# Patient Record
Sex: Male | Born: 1965 | Race: White | Hispanic: No | Marital: Married | Smoking: Never smoker
Health system: Southern US, Community
[De-identification: ages and names within clinical notes are randomized; demographics above are authoritative.]

## PROBLEM LIST (undated history)

## (undated) DIAGNOSIS — I10 Essential (primary) hypertension: Secondary | ICD-10-CM

---

## 1998-11-13 ENCOUNTER — Encounter: Payer: Self-pay | Admitting: Family Medicine

## 1998-11-13 ENCOUNTER — Ambulatory Visit (HOSPITAL_COMMUNITY): Admission: RE | Admit: 1998-11-13 | Discharge: 1998-11-13 | Payer: Self-pay | Admitting: Family Medicine

## 2004-11-23 ENCOUNTER — Emergency Department (HOSPITAL_COMMUNITY): Admission: EM | Admit: 2004-11-23 | Discharge: 2004-11-24 | Payer: Self-pay | Admitting: Emergency Medicine

## 2006-05-03 IMAGING — CR DG CHEST 2V
2 series · 2 of 2 positions shown · non-contrast
Comparison: none

CLINICAL DATA: Chest pain and diaphoresis.  
 CHEST, 2-VIEWS:
 No prior studies.
 The heart size and mediastinal contours are within normal limits.  Both lungs are clear.  The visualized skeletal structures are unremarkable.

[w chest pa]
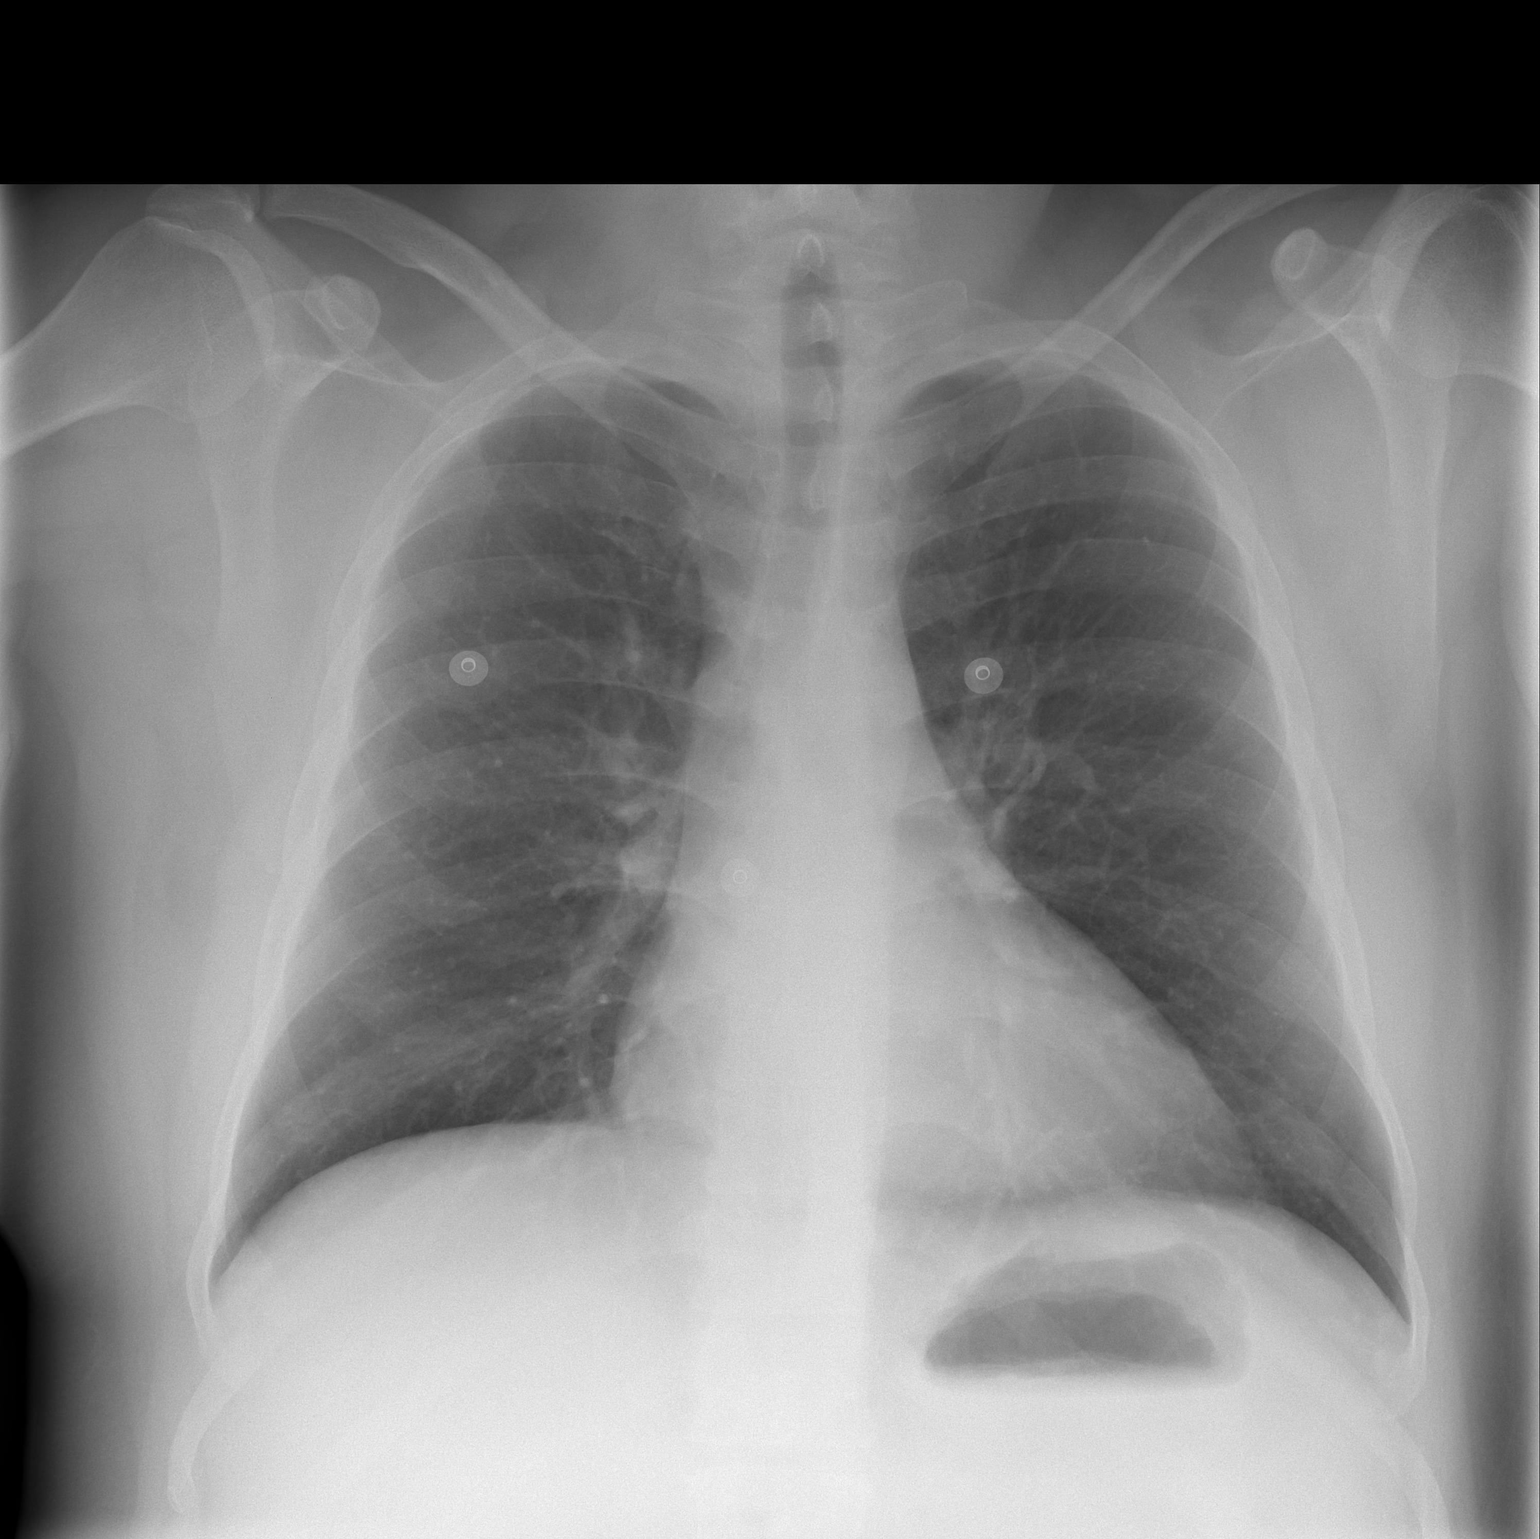

[w chest lat]
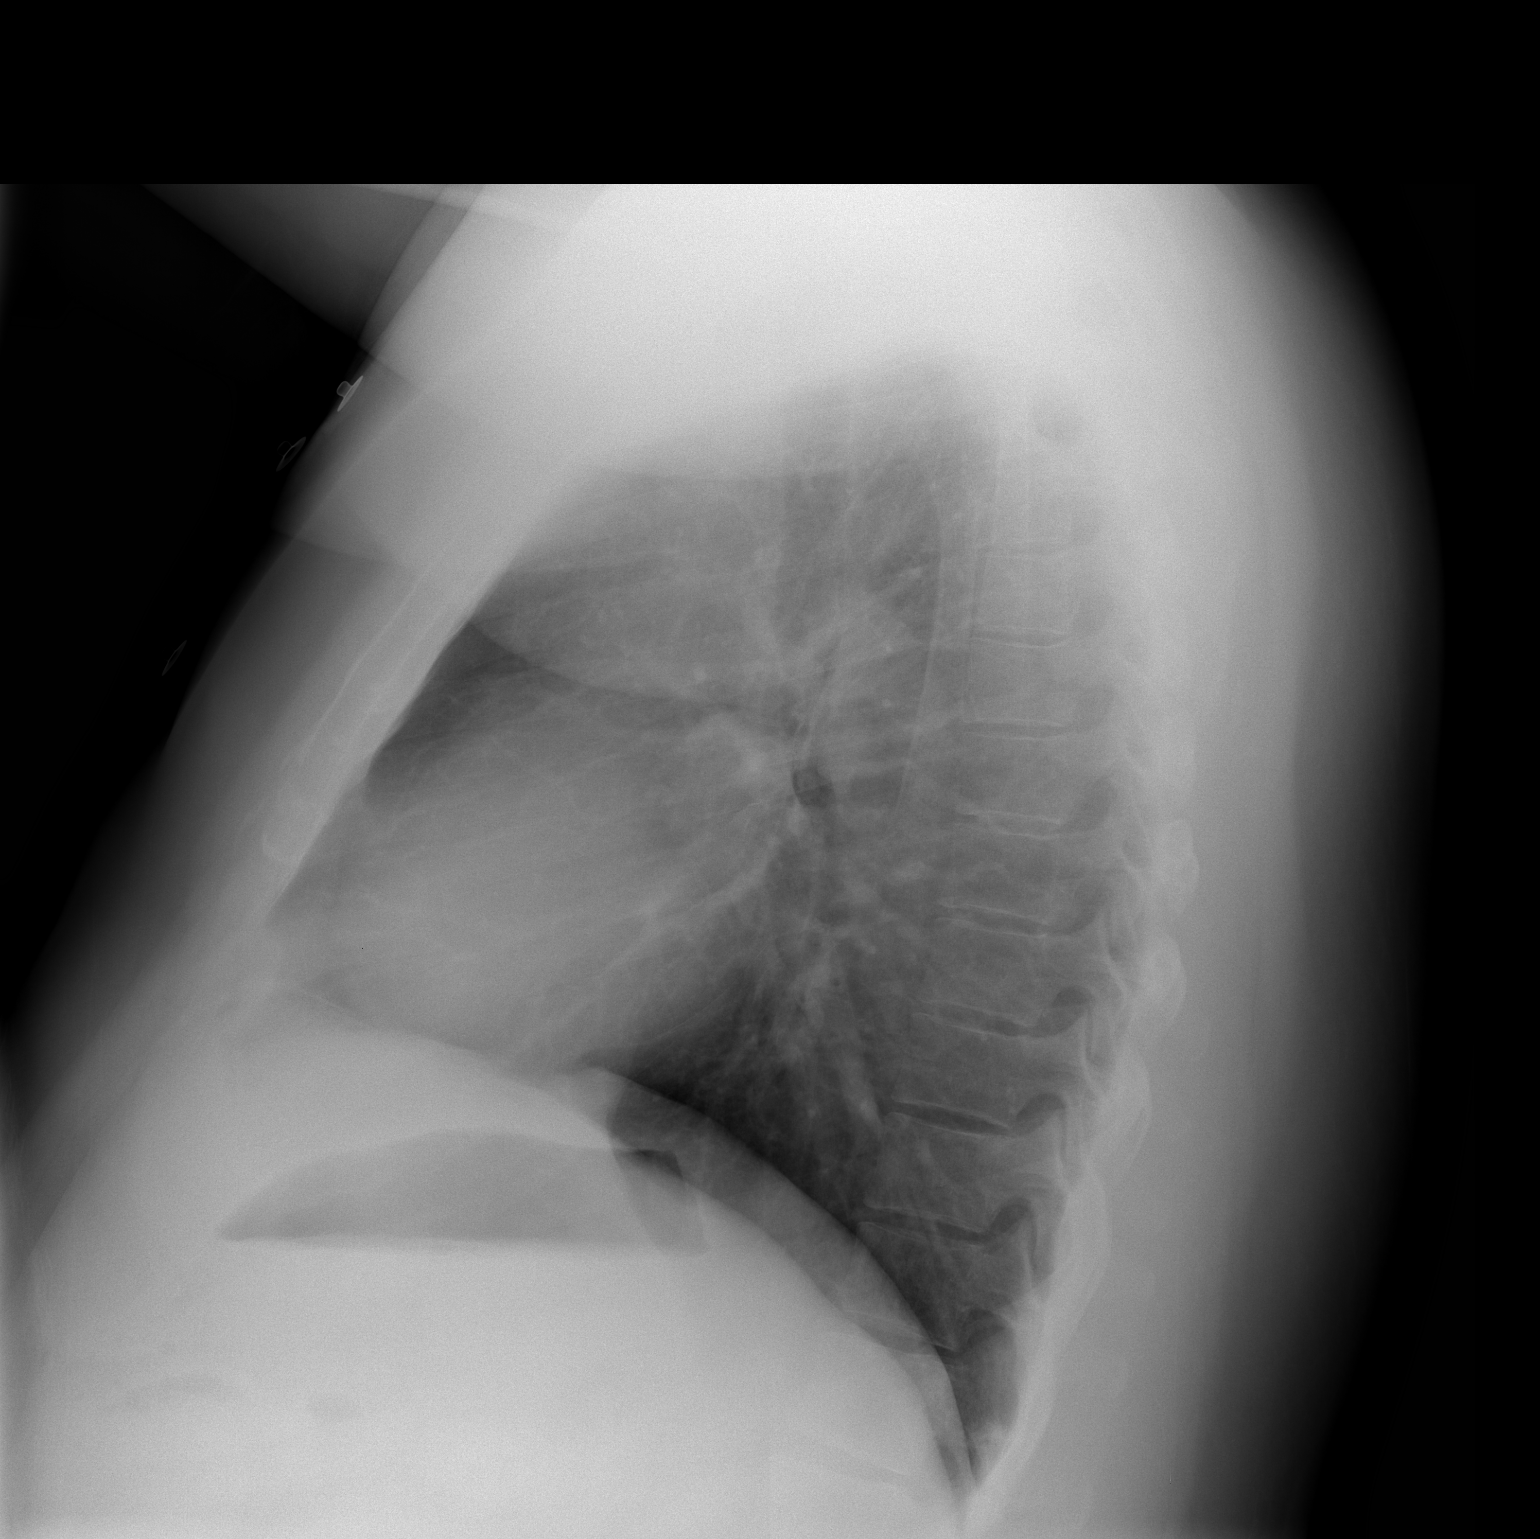

[2 of 2 positions shown; findings below may reference images not displayed]

IMPRESSION: No active cardiopulmonary disease.

## 2011-11-10 ENCOUNTER — Emergency Department (HOSPITAL_COMMUNITY)
Admission: EM | Admit: 2011-11-10 | Discharge: 2011-11-10 | Disposition: A | Discharge status: Home / Self Care | Payer: BC Managed Care – PPO | Attending: Emergency Medicine | Admitting: Emergency Medicine

## 2011-11-10 ENCOUNTER — Encounter (HOSPITAL_COMMUNITY): Payer: Self-pay

## 2011-11-10 DIAGNOSIS — S60569A Insect bite (nonvenomous) of unspecified hand, initial encounter: Secondary | ICD-10-CM | POA: Insufficient documentation

## 2011-11-10 DIAGNOSIS — T63301A Toxic effect of unspecified spider venom, accidental (unintentional), initial encounter: Secondary | ICD-10-CM

## 2011-11-10 DIAGNOSIS — W57XXXA Bitten or stung by nonvenomous insect and other nonvenomous arthropods, initial encounter: Secondary | ICD-10-CM | POA: Insufficient documentation

## 2011-11-10 DIAGNOSIS — I10 Essential (primary) hypertension: Secondary | ICD-10-CM | POA: Insufficient documentation

## 2011-11-10 DIAGNOSIS — T63391A Toxic effect of venom of other spider, accidental (unintentional), initial encounter: Secondary | ICD-10-CM | POA: Insufficient documentation

## 2011-11-10 HISTORY — DX: Essential (primary) hypertension: I10

## 2011-11-10 NOTE — ED Provider Notes (Signed)
History     CSN: [id number]  Arrival date & time 11/10/11  1035   First MD Initiated Contact with Patient 11/10/11 1100      Chief Complaint  Patient presents with  . Insect Bite    (Consider location/radiation/quality/duration/timing/severity/associated sxs/prior treatment) HPI Comments: Patient reports that just prior to arrival he was bitten by a spider on the palmar surface of his right hand approximately one hour  prior to arrival in the ED.  He states that it was a black spider on his hand and is concerned that it was a black widow spider that bit him.  He is not having any pain.  No erythema.  No swelling.  No muscle cramping or spasms.  No nausea or vomiting.  No headaches.  The history is provided by the patient.    Past Medical History  Diagnosis Date  . Hypertension     History reviewed. No pertinent past surgical history.  No family history on file.  History  Substance Use Topics  . Smoking status: Never Smoker   . Smokeless tobacco: Not on file  . Alcohol Use: No      Review of Systems  Constitutional: Negative for fever and chills.  Gastrointestinal: Negative for nausea, vomiting and abdominal pain.  Musculoskeletal: Negative for myalgias and joint swelling.  Skin: Negative for wound.  Neurological: Negative for headaches.    Allergies  Review of patient's allergies indicates no known allergies.  Home Medications   Current Outpatient Rx  Name Route Sig Dispense Refill  . ALLOPURINOL 300 MG PO TABS Oral Take 300 mg by mouth daily.    Marland Kitchen AMLODIPINE BESYLATE 5 MG PO TABS Oral Take 5 mg by mouth daily.    . ATENOLOL 100 MG PO TABS Oral Take 100 mg by mouth daily.    Marland Kitchen LISINOPRIL 2.5 MG PO TABS Oral Take 2.5 mg by mouth daily.      BP 133/77  Pulse 66  Temp 97.9 F (36.6 C) (Oral)  Resp 18  SpO2 99%  Physical Exam  Nursing note and vitals reviewed. Constitutional: He is oriented to person, place, and time. He appears well-developed and  well-nourished. No distress.  HENT:  Head: Normocephalic and atraumatic.  Mouth/Throat: Oropharynx is clear and moist.  Neck: Normal range of motion. Neck supple.  Cardiovascular: Normal rate, regular rhythm and normal heart sounds.   Pulses:      Radial pulses are 2+ on the right side, and 2+ on the left side.  Pulmonary/Chest: Effort normal and breath sounds normal. No respiratory distress. He has no wheezes.  Musculoskeletal: Normal range of motion. He exhibits no edema.  Neurological: He is alert and oriented to person, place, and time. No sensory deficit.  Skin: Skin is warm and dry. No rash noted. He is not diaphoretic. No erythema.       Right palmar surface without puncture wound, erythema, induration, edema or tenderness.  Psychiatric: He has a normal mood and affect.    ED Course  Procedures (including critical care time)  Labs Reviewed - No data to display No results found.   No diagnosis found.    MDM  Patient with a spider bite to his right hand.  No bite visualized.  No erythema, induration, or tenderness to palpation.  VSS.  Patient is asymptomatic.  Therefore, feel that patient can be discharged home.  Return precautions discussed.        Pascal Lux [place], PA-C 11/10/11 1620

## 2011-11-10 NOTE — ED Notes (Addendum)
Pt in from home states spider bite to the right hand states black widow, denies pain states coldness in extremities, states feels as if heart is racing, denies abd/muscle pain, cramping, no redness noted to the area

## 2011-11-11 NOTE — ED Provider Notes (Signed)
Medical screening examination/treatment/procedure(s) were performed by non-physician practitioner and as supervising physician I was immediately available for consultation/collaboration.  Kariem Wolfson K Linker, MD 11/11/11 1509 

## 2019-07-30 ENCOUNTER — Ambulatory Visit: Payer: 59 | Attending: Internal Medicine

## 2019-07-30 DIAGNOSIS — Z23 Encounter for immunization: Secondary | ICD-10-CM

## 2019-07-30 NOTE — Progress Notes (Signed)
   Covid-19 Vaccination Clinic  Name:  Dwayne Richards    MRN: [id number] DOB: 22-Apr-1966  07/30/2019  Mr. Dwayne Richards was observed post Covid-19 immunization for 15 minutes without incident. He was provided with Vaccine Information Sheet and instruction to access the V-Safe system.   Mr. Dwayne Richards was instructed to call 911 with any severe reactions post vaccine: Marland Kitchen Difficulty breathing  . Swelling of face and throat  . A fast heartbeat  . A bad rash all over body  . Dizziness and weakness   Immunizations Administered    Name Date Dose VIS Date Route   Pfizer COVID-19 Vaccine 07/30/2019 11:24 AM 0.3 mL 04/16/2019 Intramuscular   Manufacturer: [employer], [employer]   Lot: [id number]   NDC: [address]-7

## 2019-08-23 ENCOUNTER — Ambulatory Visit: Payer: 59 | Attending: Internal Medicine

## 2019-08-23 DIAGNOSIS — Z23 Encounter for immunization: Secondary | ICD-10-CM

## 2019-08-23 NOTE — Progress Notes (Signed)
   Covid-19 Vaccination Clinic  Name:  Dwayne Richards    MRN: [id number] DOB: 1966-04-18  08/23/2019  Mr. [name] was observed post Covid-19 immunization for 15 minutes without incident. He was provided with Vaccine Information Sheet and instruction to access the V-Safe system.   Mr. Senna was instructed to call 911 with any severe reactions post vaccine: Marland Kitchen Difficulty breathing  . Swelling of face and throat  . A fast heartbeat  . A bad rash all over body  . Dizziness and weakness   Immunizations Administered    Name Date Dose VIS Date Route   Pfizer COVID-19 Vaccine 08/23/2019  3:47 PM 0.3 mL 06/30/2018 Intramuscular   Manufacturer: [employer], [employer]   Lot: [id number]   NDC: [address]-0
